# Patient Record
Sex: Male | Born: 1985
Health system: Southern US, Community
[De-identification: ages and names within clinical notes are randomized; demographics above are authoritative.]

---
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
## 2012-06-24 ENCOUNTER — Encounter (HOSPITAL_COMMUNITY): Payer: Self-pay | Admitting: *Deleted

## 2012-06-24 ENCOUNTER — Emergency Department (HOSPITAL_COMMUNITY)
Admission: EM | Admit: 2012-06-24 | Discharge: 2012-06-24 | Disposition: A | Discharge status: Home / Self Care | Payer: Managed Care, Other (non HMO) | Attending: Emergency Medicine | Admitting: Emergency Medicine

## 2012-06-24 DIAGNOSIS — N342 Other urethritis: Secondary | ICD-10-CM | POA: Insufficient documentation

## 2012-06-24 LAB — RPR: RPR Ser Ql: NONREACTIVE

## 2012-06-24 MED ORDER — AZITHROMYCIN 250 MG PO TABS
1000.0000 mg | ORAL_TABLET | Freq: Once | ORAL | Status: AC
  Administered 2012-06-24: 1000 mg via ORAL
  Filled 2012-06-24: qty 4

## 2012-06-24 MED ORDER — LIDOCAINE HCL (PF) 1 % IJ SOLN
INTRAMUSCULAR | Status: AC
  Administered 2012-06-24: 2 mL
  Filled 2012-06-24: qty 5

## 2012-06-24 MED ORDER — CEFTRIAXONE SODIUM 250 MG IJ SOLR
250.0000 mg | Freq: Once | INTRAMUSCULAR | Status: AC
  Administered 2012-06-24: 250 mg via INTRAMUSCULAR
  Filled 2012-06-24: qty 250

## 2012-06-24 NOTE — ED Notes (Signed)
C/o pain at the penis tip, onset 2d ago, (denies: abd or back pain, nvd, fever, d/c, dysuria), no meds PTA.

## 2012-06-24 NOTE — ED Notes (Signed)
Pt last had intercourse yesterday.

## 2012-06-24 NOTE — ED Provider Notes (Signed)
History     CSN: [id number]  Arrival date & time 06/24/12  1191   First MD Initiated Contact with Patient 06/24/12 [phone]      Chief Complaint  Patient presents with  . Penis Pain    (Consider location/radiation/quality/duration/timing/severity/associated sxs/prior treatment) HPI History provided by patient. Is sexually active. Has burning pain at the tip of his penis, states it feels like it's on the inside. No hematuria. No discharge. No rash or lesion. No history of same. No known STD exposures. No testicle pain. No ulcers. No medications.  Symptoms mild to moderate in severity. No known alleviating factors.  History reviewed. No pertinent past medical history.  History reviewed. No pertinent past surgical history.  No family history on file.  History  Substance Use Topics  . Smoking status: Never Smoker   . Smokeless tobacco: Not on file  . Alcohol Use: No      Review of Systems  Constitutional: Negative for fever and chills.  HENT: Negative for neck pain and neck stiffness.   Eyes: Negative for pain.  Respiratory: Negative for shortness of breath.   Cardiovascular: Negative for chest pain.  Gastrointestinal: Negative for abdominal pain.  Genitourinary: Negative for dysuria, hematuria, flank pain, discharge and difficulty urinating.  Musculoskeletal: Negative for back pain.  Skin: Negative for rash.  Neurological: Negative for headaches.  All other systems reviewed and are negative.    Allergies  Review of patient's allergies indicates no known allergies.  Home Medications  No current outpatient prescriptions on file.  BP 134/78  Pulse 88  Temp(Src) 98.4 F (36.9 C) (Oral)  Resp 14  SpO2 100%  Physical Exam  Constitutional: He is oriented to person, place, and time. He appears well-developed and well-nourished.  HENT:  Head: Normocephalic and atraumatic.  Eyes: EOM are normal. Pupils are equal, round, and reactive to light.  Neck: Neck supple.   Cardiovascular: Regular rhythm and intact distal pulses.   Pulmonary/Chest: Effort normal. No respiratory distress.  Genitourinary:  Circumcised. No erythema. No rash or lesion. No testicle tenderness.  Musculoskeletal: Normal range of motion. He exhibits no edema.  Neurological: He is alert and oriented to person, place, and time.  Skin: Skin is warm and dry.    ED Course  Procedures (including critical care time)  Labs Reviewed  GC/CHLAMYDIA PROBE AMP  RPR    azithromycin. Rocephin.  Referral to health department for results of GC Chlamydia RPR. STD precautions.    MDM  Clinical urethritis and sexually active young adult male  Treated for possible STD, Cx results pending  Vital signs and nursing notes reviewed and considered      Sunnie Nielsen, MD 06/24/12 0700

## 2012-06-24 NOTE — ED Notes (Signed)
Pt is denying discharge. Pt states he is not having any problems with urinating. No abdominal or pelvic pain noted. No fever. Pt states he just has a pain on the tip of his penis.

## 2012-06-24 NOTE — ED Notes (Signed)
No adverse reaction noted to the medication. Patient states "I feel fine"

## 2012-06-26 LAB — GC/CHLAMYDIA PROBE AMP: CT Probe RNA: NEGATIVE

## 2016-07-02 ENCOUNTER — Encounter (HOSPITAL_COMMUNITY): Payer: Self-pay

## 2016-07-02 ENCOUNTER — Emergency Department (HOSPITAL_COMMUNITY)
Admission: EM | Admit: 2016-07-02 | Discharge: 2016-07-03 | Disposition: A | Discharge status: Home / Self Care | Payer: Worker's Compensation | Attending: Emergency Medicine | Admitting: Emergency Medicine

## 2016-07-02 DIAGNOSIS — S61213A Laceration without foreign body of left middle finger without damage to nail, initial encounter: Secondary | ICD-10-CM | POA: Diagnosis not present

## 2016-07-02 DIAGNOSIS — Y990 Civilian activity done for income or pay: Secondary | ICD-10-CM | POA: Insufficient documentation

## 2016-07-02 DIAGNOSIS — Y939 Activity, unspecified: Secondary | ICD-10-CM | POA: Diagnosis not present

## 2016-07-02 DIAGNOSIS — Y929 Unspecified place or not applicable: Secondary | ICD-10-CM | POA: Insufficient documentation

## 2016-07-02 DIAGNOSIS — Z23 Encounter for immunization: Secondary | ICD-10-CM | POA: Insufficient documentation

## 2016-07-02 DIAGNOSIS — W268XXA Contact with other sharp object(s), not elsewhere classified, initial encounter: Secondary | ICD-10-CM | POA: Insufficient documentation

## 2016-07-02 DIAGNOSIS — S6992XA Unspecified injury of left wrist, hand and finger(s), initial encounter: Secondary | ICD-10-CM | POA: Diagnosis present

## 2016-07-02 MED ORDER — LIDOCAINE HCL (PF) 1 % IJ SOLN
5.0000 mL | Freq: Once | INTRAMUSCULAR | Status: AC
  Administered 2016-07-02: 5 mL via INTRADERMAL
  Filled 2016-07-02: qty 5

## 2016-07-02 NOTE — ED Triage Notes (Signed)
Pt states that he was at work at cut his middle finger on his L hand. Small half in laceration, bleeding controlled, last tetanus unknown.

## 2016-07-03 ENCOUNTER — Emergency Department (HOSPITAL_COMMUNITY): Payer: Worker's Compensation

## 2016-07-03 MED ORDER — OXYCODONE-ACETAMINOPHEN 5-325 MG PO TABS
1.0000 | ORAL_TABLET | Freq: Once | ORAL | Status: DC
Start: 2016-07-03 — End: 2016-07-03
  Filled 2016-07-03: qty 1

## 2016-07-03 MED ORDER — TETANUS-DIPHTH-ACELL PERTUSSIS 5-2.5-18.5 LF-MCG/0.5 IM SUSP
0.5000 mL | Freq: Once | INTRAMUSCULAR | Status: AC
  Administered 2016-07-03: 0.5 mL via INTRAMUSCULAR

## 2016-07-03 NOTE — Discharge Instructions (Signed)
Please have your stitches removed in 7 days. You may return to the Emergency Department or visit your primary care provider. Please keep the wound dry and wear your finger splint. Clean the wound with soap and water. Keep the finger covered when you are at work. Return to the Emergency Department if you develop new or worsening symptoms.

## 2016-07-03 NOTE — ED Notes (Signed)
Patient left at this time with all belongings. 

## 2016-07-04 NOTE — ED Provider Notes (Signed)
[facility]-EMERGENCY DEPT Provider Note   CSN: [id number] Arrival date & time: 07/02/16  2216     History   Chief Complaint Chief Complaint  Patient presents with  . Finger Injury    HPI Dominic Faulkner is a 31 y.o. male who presents with a laceration to the third digit of the left hand after got the digit cut in a machine at work. The wound is hemostatic in the ED. No other complaints at this time. Minimal pain at time this time. Last tetanus unknown.  No chronic medical conditions and no daily medications. NKA.   HPI  History reviewed. No pertinent past medical history.  There are no active problems to display for this patient.   History reviewed. No pertinent surgical history.     Home Medications    Prior to Admission medications   Not on File    Family History No family history on file.  Social History Social History  Substance Use Topics  . Smoking status: Never Smoker  . Smokeless tobacco: Not on file  . Alcohol use No     Allergies   Patient has no known allergies.   Review of Systems Review of Systems  Constitutional: Negative for activity change.  Respiratory: Negative for shortness of breath.   Cardiovascular: Negative for chest pain.  Gastrointestinal: Negative for anal bleeding.  Musculoskeletal: Negative for arthralgias and joint swelling.  Skin: Positive for wound.  Allergic/Immunologic: Negative for immunocompromised state.   Physical Exam Updated Vital Signs BP 136/85   Pulse 70   Temp 98.7 F (37.1 C)   Resp 16   Ht  (1.753 m)   Wt 71.7 kg   SpO2 100%   BMI 23.33 kg/m   Physical Exam  Constitutional: He appears well-developed and well-nourished.  HENT:  Head: Normocephalic and atraumatic.  Eyes: Conjunctivae are normal.  Neck: Neck supple.  Cardiovascular: Normal rate and regular rhythm.   No murmur heard. Pulmonary/Chest: Effort normal and breath sounds normal. No respiratory distress. He has no wheezes. He has no  rales.  Abdominal: Soft. He exhibits no distension. There is no tenderness. There is no guarding.  Musculoskeletal: He exhibits no edema.  1 cm hemostatic laceration to the left third digit between the DIP and PIP joint. NVI. Good strength to the third digit. No swelling, erythema, or warmth to the left hand.   Neurological: He is alert.  Skin: Skin is warm and dry.  Psychiatric: His behavior is normal.  Nursing note and vitals reviewed.  ED Treatments / Results  Labs (all labs ordered are listed, but only abnormal results are displayed) Labs Reviewed - No data to display  EKG  EKG Interpretation None       Radiology Dg Hand Complete Left  Result Date: 07/03/2016 CLINICAL DATA:  Injury to the middle finger, laceration EXAM: LEFT HAND - COMPLETE 3+ VIEW COMPARISON:  None. FINDINGS: No fracture or malalignment. No radiopaque foreign body. Soft tissue laceration along the palmar aspect of the distal third digit. IMPRESSION: No acute osseous abnormality Electronically Signed   By: Jasmine Pang M.D.   On: 07/03/2016 00:48    Procedures .Marland KitchenLaceration Repair Date/Time: 07/04/2016 3:58 PM Performed by: Dierdre Forth Authorized by: Frederik Pear A   Consent:    Consent obtained:  Verbal   Consent given by:  Patient   Risks discussed:  Infection, pain and poor wound healing   Alternatives discussed:  No treatment Anesthesia (see MAR for exact dosages):  Anesthesia method:  Local infiltration   Local anesthetic:  Lidocaine 1% w/o epi Laceration details:    Location: left third finger.   Length (cm):  1 Repair type:    Repair type:  Simple Pre-procedure details:    Preparation:  Patient was prepped and draped in usual sterile fashion and imaging obtained to evaluate for foreign bodies Exploration:    Hemostasis achieved with:  Direct pressure   Wound exploration: wound explored through full range of motion and entire depth of wound probed and visualized     Wound  extent: no fascia violation noted, no foreign bodies/material noted, no muscle damage noted, no nerve damage noted, no tendon damage noted, no underlying fracture noted and no vascular damage noted     Contaminated: no   Treatment:    Area cleansed with:  Shur-Clens   Amount of cleaning:  Standard   Irrigation method:  Pressure wash Skin repair:    Repair method:  Sutures   Suture size:  5-0   Suture material:  Prolene   Suture technique:  Horizontal mattress   Number of sutures:  3 Approximation:    Approximation:  Close Post-procedure details:    Dressing:  Splint for protection   Patient tolerance of procedure:  Tolerated well, no immediate complications   (including critical care time)  Medications Ordered in ED Medications  lidocaine (PF) (XYLOCAINE) 1 % injection 5 mL (5 mLs Intradermal Given 07/02/16 2340)  Tdap (BOOSTRIX) injection 0.5 mL (0.5 mLs Intramuscular Given 07/03/16 0350)   Initial Impression / Assessment and Plan / ED Course  I have reviewed the triage vital signs and the nursing notes.  Pertinent labs & imaging results that were available during my care of the patient were reviewed by me and considered in my medical decision making (see chart for details).     Tdap booster given.Pressure irrigation performed. Laceration occurred < 8 hours prior to repair which was well tolerated. Pt has no co morbidities to effect normal wound healing. No tendon or underlying muscle involvement after physical exam. Discussed and evaluated the patient with Dierdre Forth, PA-C. Discussed suture home care w pt and answered questions. Pt to f-u for wound check and suture removal in 7 days. Pt is hemodynamically stable w no complaints prior to dc.    Final Clinical Impressions(s) / ED Diagnoses   Final diagnoses:  Laceration of left middle finger without foreign body without damage to nail, initial encounter    New Prescriptions There are no discharge medications for this  patient.    Barkley Boards, PA-C 07/04/16 1607    Gilda Crease, MD 07/08/16 [phone]

## 2017-07-12 ENCOUNTER — Ambulatory Visit (INDEPENDENT_AMBULATORY_CARE_PROVIDER_SITE_OTHER): Payer: Managed Care, Other (non HMO)

## 2017-07-12 ENCOUNTER — Telehealth: Payer: Self-pay | Admitting: Podiatry

## 2017-07-12 ENCOUNTER — Ambulatory Visit (INDEPENDENT_AMBULATORY_CARE_PROVIDER_SITE_OTHER): Payer: Managed Care, Other (non HMO) | Admitting: Podiatry

## 2017-07-12 VITALS — BP 141/85 | HR 62

## 2017-07-12 DIAGNOSIS — M2141 Flat foot [pes planus] (acquired), right foot: Secondary | ICD-10-CM

## 2017-07-12 DIAGNOSIS — M79671 Pain in right foot: Secondary | ICD-10-CM | POA: Diagnosis not present

## 2017-07-12 DIAGNOSIS — M7661 Achilles tendinitis, right leg: Secondary | ICD-10-CM

## 2017-07-12 DIAGNOSIS — M2142 Flat foot [pes planus] (acquired), left foot: Secondary | ICD-10-CM

## 2017-07-12 DIAGNOSIS — M79672 Pain in left foot: Secondary | ICD-10-CM

## 2017-07-12 DIAGNOSIS — M205X9 Other deformities of toe(s) (acquired), unspecified foot: Secondary | ICD-10-CM

## 2017-07-12 MED ORDER — MELOXICAM 15 MG PO TABS: 15.0000 mg | ORAL_TABLET | Freq: Every day | ORAL | 2 refills | Status: AC

## 2017-07-12 NOTE — Progress Notes (Signed)
Subjective:   Patient ID: Dominic Faulkner, male   DOB: 32 y.o.   MRN: [id number]   HPI 32 year old male presents the office today for concerns of painful corns to both of his fifth toes which cause pain with pressure in shoes with the left side worse than the right.  He said no recent treatment for this.  Denies any recent injury.  He also has pain to the back of his right heel and he points to the Achilles tendon.  This is been ongoing for some time.  Denies any recent injury or trauma.  He has flat feet.  He has no other concerns.   Review of Systems  All other systems reviewed and are negative.  No past medical history on file.  No past surgical history on file.   Current Outpatient Medications:  .  chlorhexidine (PERIDEX) 0.12 % solution, chlorhexidine gluconate 0.12 % mouthwash  USE AS DIRECTED, Disp: , Rfl:  .  meloxicam (MOBIC) 15 MG tablet, meloxicam 15 mg tablet, Disp: , Rfl:  .  meloxicam (MOBIC) 15 MG tablet, Take 1 tablet (15 mg total) by mouth daily., Disp: 30 tablet, Rfl: 2 .  naproxen (NAPROSYN) 500 MG tablet, naproxen 500 mg tablet  TAKE 1 TABLET BY MOUTH TWICE A DAY AS DIRECTED, Disp: , Rfl:   No Known Allergies  Social History   Socioeconomic History  . Marital status: Single    Spouse name: Not on file  . Number of children: Not on file  . Years of education: Not on file  . Highest education level: Not on file  Occupational History  . Not on file  Social Needs  . Financial resource strain: Not on file  . Food insecurity:    Worry: Not on file    Inability: Not on file  . Transportation needs:    Medical: Not on file    Non-medical: Not on file  Tobacco Use  . Smoking status: Never Smoker  Substance and Sexual Activity  . Alcohol use: No  . Drug use: No  . Sexual activity: Not on file  Lifestyle  . Physical activity:    Days per week: Not on file    Minutes per session: Not on file  . Stress: Not on file  Relationships  . Social connections:     Talks on phone: Not on file    Gets together: Not on file    Attends religious service: Not on file    Active member of club or organization: Not on file    Attends meetings of clubs or organizations: Not on file    Relationship status: Not on file  . Intimate partner violence:    Fear of current or ex partner: Not on file    Emotionally abused: Not on file    Physically abused: Not on file    Forced sexual activity: Not on file  Other Topics Concern  . Not on file  Social History Narrative  . Not on file        Objective:  Physical Exam  General: AAO x3, NAD  Dermatological: Hyperkeratotic lesions present to the dorsal lateral aspect of bilateral fifth toes with the left side worse than the right.  Upon debridement there is no underlying ulceration, drainage or any signs of infection noted today.  No other open lesions or pre-ulcerative lesions.  Vascular: Dorsalis Pedis artery and Posterior Tibial artery pedal pulses are 2/4 bilateral with immedate capillary fill time. Pedal hair growth present.  No varicosities and no lower extremity edema present bilateral. There is no pain with calf compression, swelling, warmth, erythema.   Neruologic: Grossly intact via light touch bilateral. Vibratory intact via tuning fork bilateral. Protective threshold with Semmes Wienstein monofilament intact to all pedal sites bilateral.  Musculoskeletal: Mild tenderness palpation of the right Achilles tendon on the mid substance.  Thompson test is negative and there is no defect noted within the Achilles tendon.  There is no thickening.  There is no overlying edema, erythema, increase in warmth.  There is no pain with lateral compression of the calcaneus.  Forefoot is present.  Muscular strength 5/5 in all groups tested bilateral.  Adductovarus right and left  Gait: Unassisted, Nonantalgic.       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  -Sharply debrided the hyperkeratotic lesions to bilateral fifth toe so that any complications or bleeding.  We discussed with conservative as well as surgical options.  We will start with offloading we also discussed shoe modifications.  We discussed moisturizer the callus is daily but not interdigitally.  Also discussed orthotics.  If symptoms continue we will consider surgical intervention which we discussed today. -Regards to the Achilles tendinitis I prescribed meloxicam.  Stretching, icing exercises daily as well as supportive shoes and orthotics. Will check insurance coverage.   Vivi Barrack DPM

## 2017-07-12 NOTE — Patient Instructions (Signed)

## 2017-07-12 NOTE — Telephone Encounter (Signed)
Called pt to give benefit coverage for orthotics and voicemail is full so I could not leave a message.

## 2017-08-16 ENCOUNTER — Telehealth: Payer: Self-pay | Admitting: Podiatry

## 2017-08-16 NOTE — Telephone Encounter (Signed)
Called pt again and he answered gave him benefit information and he wants to proceed and is scheduled to see Raiford NobleRick on 6.10.19

## 2017-08-22 ENCOUNTER — Ambulatory Visit (INDEPENDENT_AMBULATORY_CARE_PROVIDER_SITE_OTHER): Payer: Self-pay | Admitting: Orthotics

## 2017-08-22 DIAGNOSIS — M2141 Flat foot [pes planus] (acquired), right foot: Secondary | ICD-10-CM | POA: Diagnosis not present

## 2017-08-22 DIAGNOSIS — M2142 Flat foot [pes planus] (acquired), left foot: Secondary | ICD-10-CM | POA: Diagnosis not present

## 2017-08-22 DIAGNOSIS — M7661 Achilles tendinitis, right leg: Secondary | ICD-10-CM

## 2017-08-22 NOTE — Progress Notes (Signed)
Dominic Faulkner came into today to be cast for Custom Foot Orthotics. Upon recommendation of Dr. Ardelle AntonWagoner Dominic Faulkner presents with painful keratomas b/l and achilles tendonits R Goals are offloading 5 b/l and heel raise b/l Plan vendor [place]

## 2017-09-05 ENCOUNTER — Ambulatory Visit (INDEPENDENT_AMBULATORY_CARE_PROVIDER_SITE_OTHER): Payer: Managed Care, Other (non HMO) | Admitting: Orthotics

## 2017-09-05 DIAGNOSIS — M7661 Achilles tendinitis, right leg: Secondary | ICD-10-CM

## 2017-09-05 NOTE — Progress Notes (Signed)
Patient came in today to pick up custom made foot orthotics.  The goals were accomplished and the patient reported no dissatisfaction with said orthotics.  Patient was advised of breakin period and how to report any issues. 

## 2019-02-22 IMAGING — CR DG HAND COMPLETE 3+V*L*
3 series · 3 of 3 positions shown · non-contrast
Comparison: None.

CLINICAL DATA: Injury to the middle finger, laceration

EXAM:
LEFT HAND - COMPLETE 3+ VIEW

[hand pa]
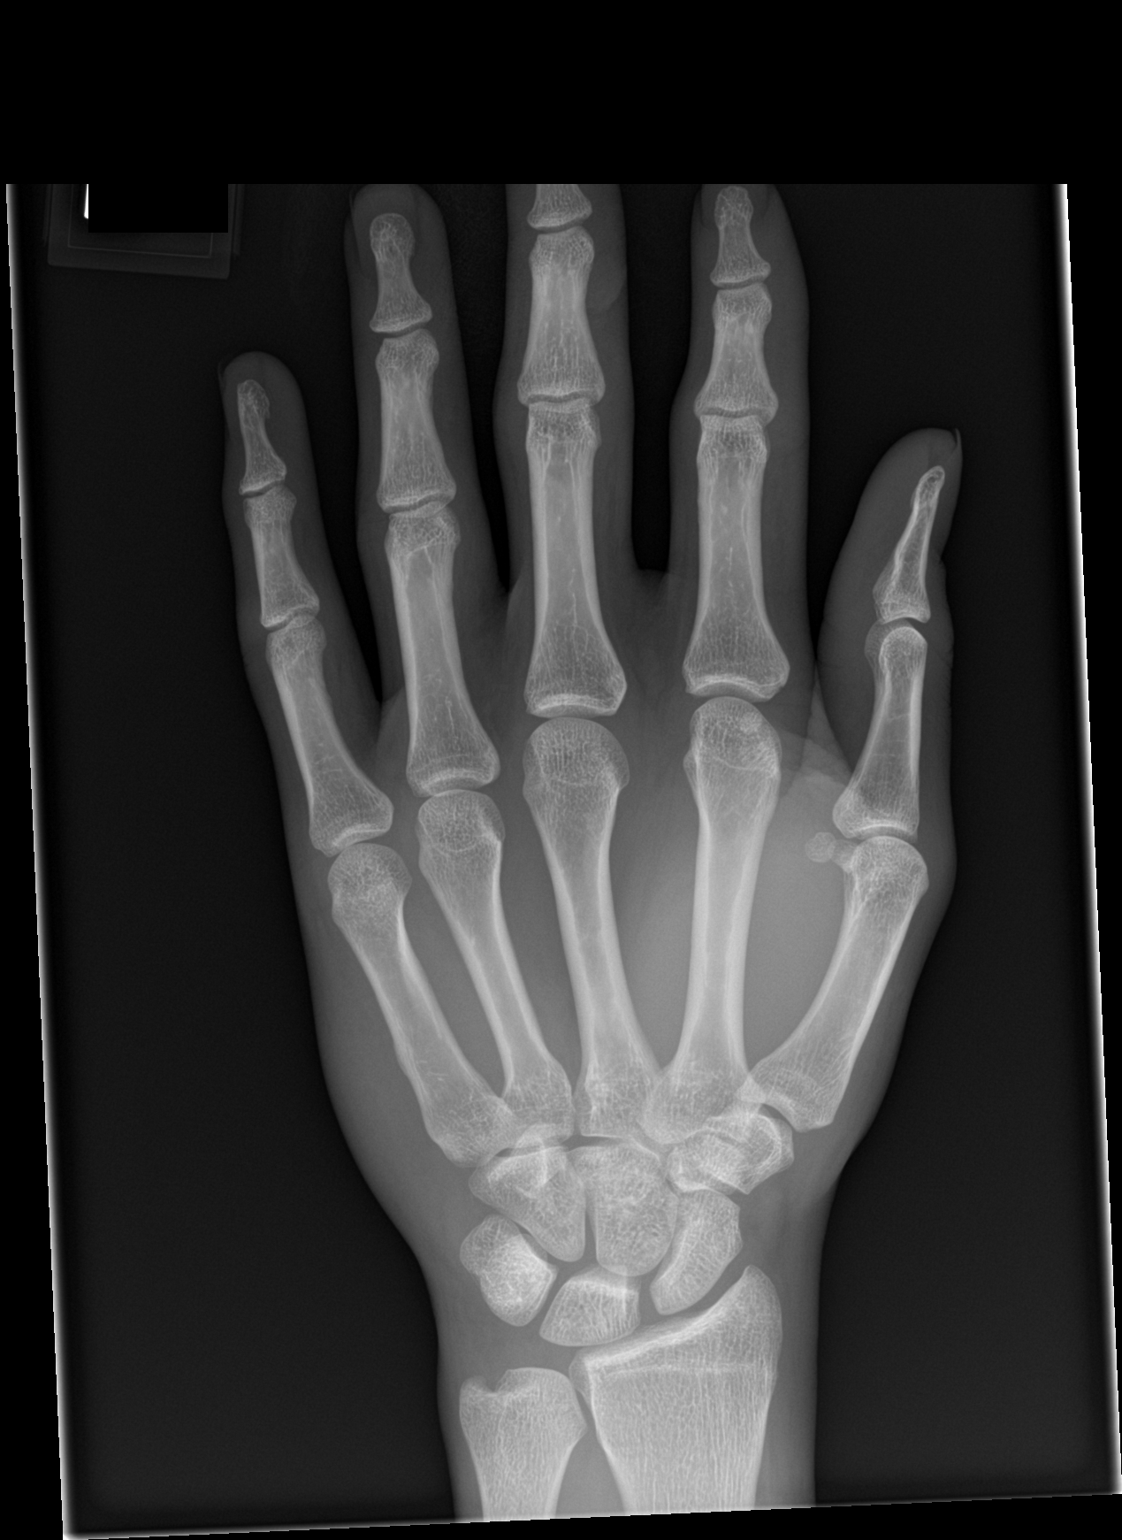

[hand obl]
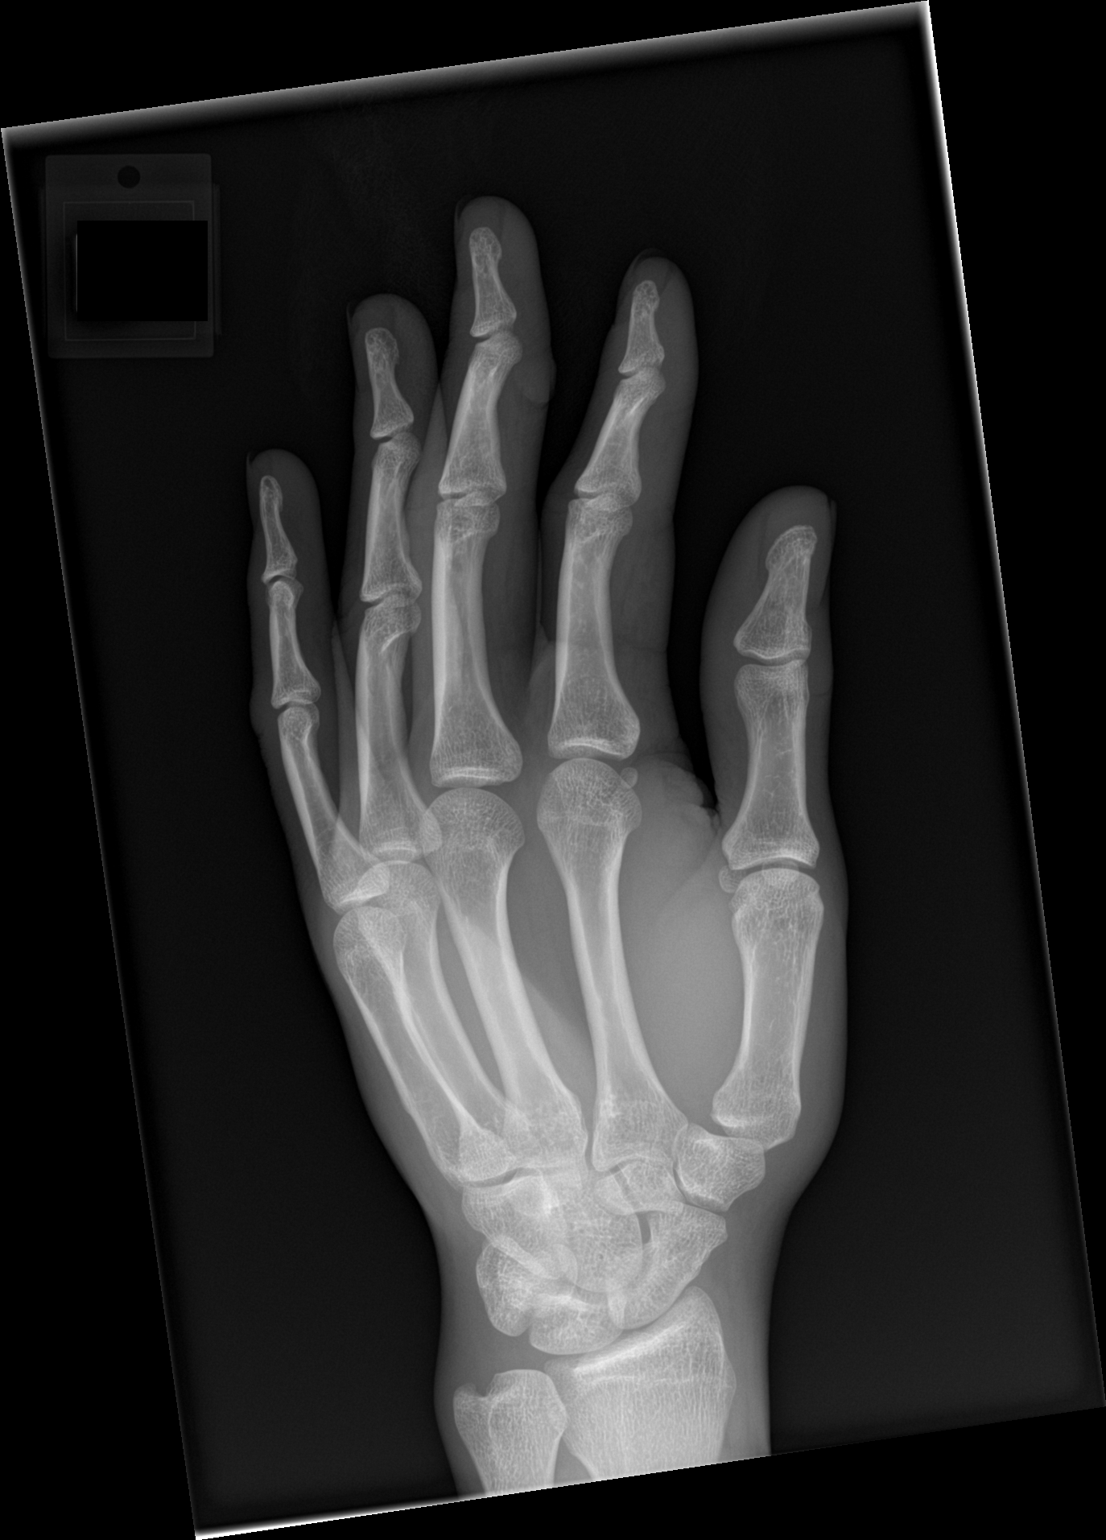

[hand lat]
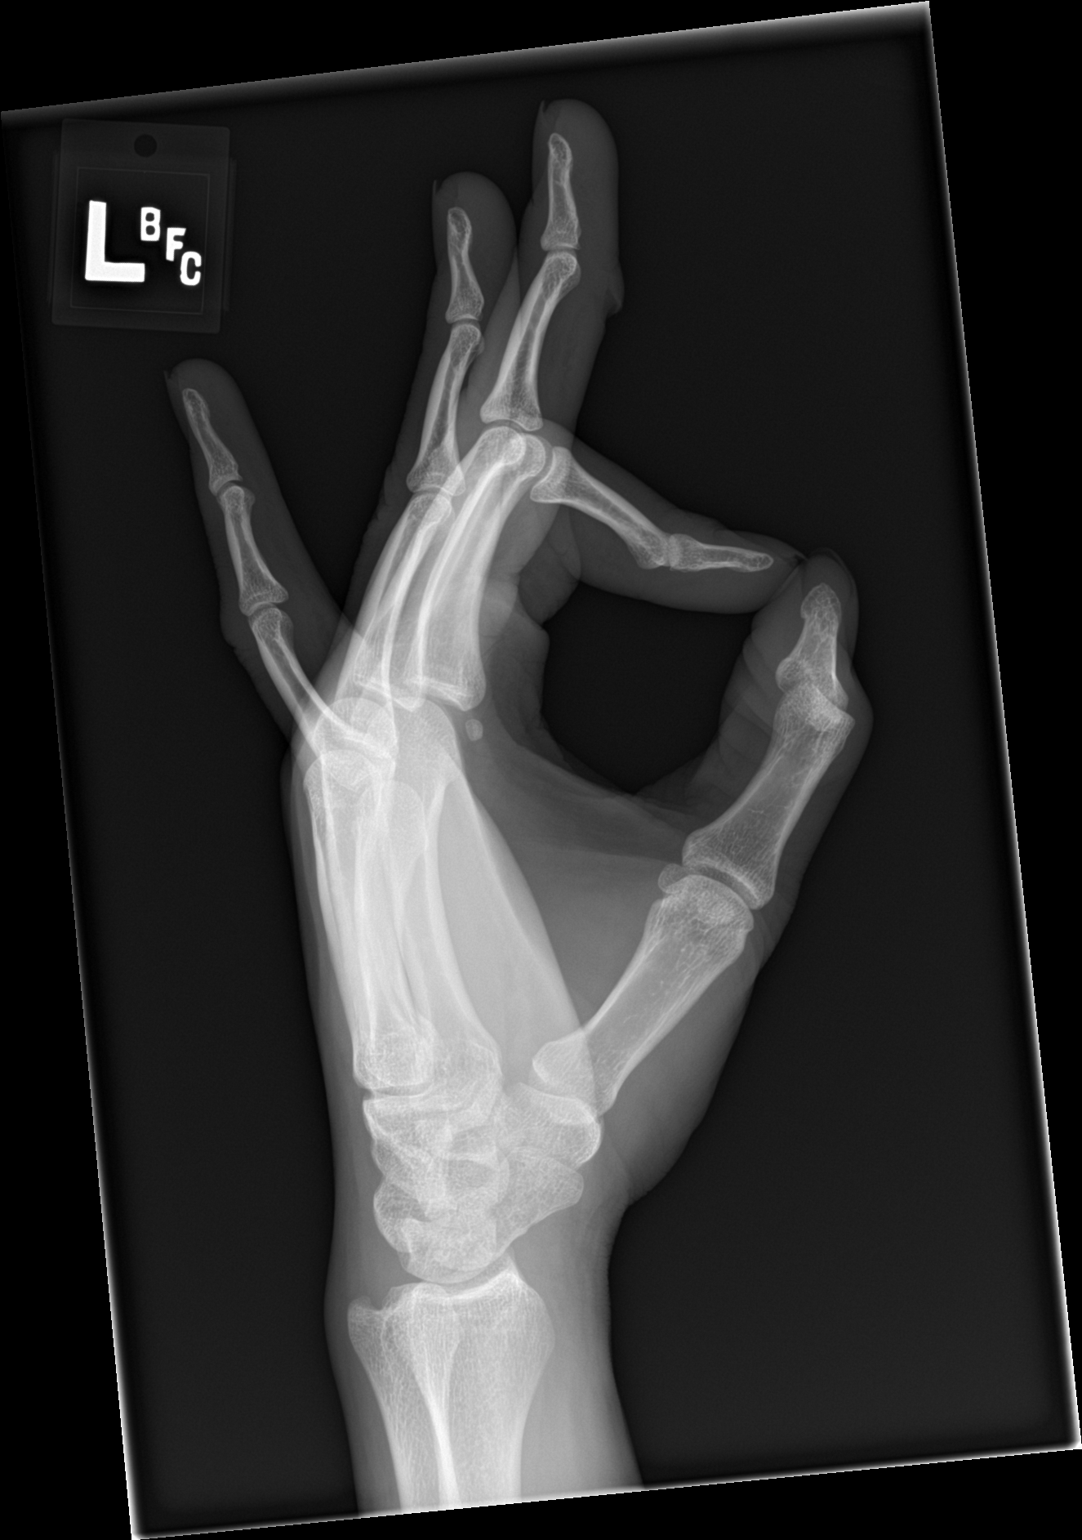

[3 of 3 positions shown; findings below may reference images not displayed]

FINDINGS: No fracture or malalignment. No radiopaque foreign body. Soft tissue
laceration along the palmar aspect of the distal third digit.
IMPRESSION: No acute osseous abnormality

## 2023-05-24 ENCOUNTER — Emergency Department (HOSPITAL_COMMUNITY)
Admission: EM | Admit: 2023-05-24 | Discharge: 2023-05-24 | Disposition: A | Discharge status: Home / Self Care | Payer: Self-pay | Attending: Emergency Medicine | Admitting: Emergency Medicine

## 2023-05-24 ENCOUNTER — Encounter (HOSPITAL_COMMUNITY): Payer: Self-pay

## 2023-05-24 ENCOUNTER — Other Ambulatory Visit: Payer: Self-pay

## 2023-05-24 ENCOUNTER — Emergency Department (HOSPITAL_COMMUNITY): Payer: Self-pay

## 2023-05-24 DIAGNOSIS — W231XXA Caught, crushed, jammed, or pinched between stationary objects, initial encounter: Secondary | ICD-10-CM | POA: Insufficient documentation

## 2023-05-24 DIAGNOSIS — Y9367 Activity, basketball: Secondary | ICD-10-CM | POA: Insufficient documentation

## 2023-05-24 DIAGNOSIS — S63634A Sprain of interphalangeal joint of right ring finger, initial encounter: Secondary | ICD-10-CM | POA: Insufficient documentation

## 2023-05-24 NOTE — ED Provider Notes (Signed)
 [place] EM[facility] DEPARTMENT AT [facility] Provider Note   CSN: [id number] Arrival date & time: 05/24/23  6578     History  Chief Complaint  Patient presents with   Finger Injury    Dominic Faulkner is a 38 y.o. male patient with no significant past medical history reporting to emergency room with right fourth digit pain.  Patient reports that 3 weeks ago he was playing basketball and jammed his finger against under the ball.  Patient reports that he fell his finger dislocate.  Since then he has had constant pain and swelling over proximal phalangeal joint.  He has been taking Tylenol which helps improve symptoms temporarily.  Patient reports he is not able to move his finger without significant discomfort and swelling limits full range of motion.  He has had no recent surgeries on this hand.  No open laceration.  HPI     Home Medications Prior to Admission medications   Medication Sig Start Date End Date Taking? Authorizing Provider  chlorhexidine (PERIDEX) 0.12 % solution chlorhexidine gluconate 0.12 % mouthwash  USE AS DIRECTED    [provider]  meloxicam (MOBIC) 15 MG tablet meloxicam 15 mg tablet    [provider]  naproxen (NAPROSYN) 500 MG tablet naproxen 500 mg tablet  TAKE 1 TABLET BY MOUTH TWICE A DAY AS DIRECTED    [provider]      Allergies    Patient has no known allergies.    Review of Systems   Review of Systems  Musculoskeletal:  Positive for arthralgias.    Physical Exam Updated Vital Signs BP (!) 152/90 (BP Location: Left Arm)   Pulse 88   Temp 98.4 F (36.9 C) (Oral)   Resp 16   Ht 5\' 9"  (1.753 m)   Wt 74.8 kg   SpO2 99%   BMI 24.37 kg/m  Physical Exam Vitals and nursing note reviewed.  Constitutional:      General: He is not in acute distress.    Appearance: He is not toxic-appearing.  HENT:     Head: Normocephalic and atraumatic.  Eyes:     General: No scleral icterus.     Conjunctiva/sclera: Conjunctivae normal.  Cardiovascular:     Rate and Rhythm: Normal rate and regular rhythm.     Pulses: Normal pulses.     Heart sounds: Normal heart sounds.  Pulmonary:     Effort: Pulmonary effort is normal. No respiratory distress.     Breath sounds: Normal breath sounds.  Abdominal:     General: Abdomen is flat. Bowel sounds are normal.     Palpations: Abdomen is soft.     Tenderness: There is no abdominal tenderness.  Musculoskeletal:     Comments: Right 4th PIP joint swelling. No deformity noted. ROM of PIP limited due to pain and swelling. No other focal area of tenderness. Neurovascularly intact.   Skin:    General: Skin is warm and dry.     Findings: No lesion.  Neurological:     General: No focal deficit present.     Mental Status: He is alert and oriented to person, place, and time. Mental status is at baseline.     ED Results / Procedures / Treatments   Labs (all labs ordered are listed, but only abnormal results are displayed) Labs Reviewed - No data to display  EKG None  Radiology No results found.  Procedures Procedures    Medications Ordered in ED Medications - No data  to display  ED Course/ Medical Decision Making/ A&P                                 Medical Decision Making Amount and/or Complexity of Data Reviewed Radiology: ordered.   This patient presents to the ED for concern of finger pain, this involves an extensive number of treatment options, and is a complaint that carries with it a high risk of complications and morbidity.  The differential diagnosis includes gout, pseudogout, dislocation, subluxation, fracture, sprain, laceration, abscess, septic joint    Imaging Studies ordered:  I ordered imaging studies including right index finger x-ray  I independently visualized and interpreted imaging which showed PENDING at time of discharge however I did review the imaging and I do not see any acute obvious fracture nor  any dislocation   Problem List / ED Course / Critical interventions / Medication management  Patient reporting with right finger pain.  This has been ongoing for 3 weeks.  He has no sign of infection.  He is able to initiate range of motion.  He did have initial injury in which he jammed and dislocated finger. He was able to relocate it. Has had pain, swelling and decreased ROM since injury. He is neurovascularly intact.  Vital signs are stable and he is well-appearing.  No fever. Will obtain x-ray to rule out fracture or other pathology. He has not other injuries during incident, thus no further workup needed at this time. Patient reports pain is under control at this time.  Printed imaging for patient.  No obvious fracture or dislocation.  This has been ongoing for 3 weeks.  Will buddy tape fingers.  Patient already has finger splint at home.  I will give him follow-up for orthopedics.  He does not currently have a primary care.  He is stable for discharge.  I have reviewed the patients home medicines and have made adjustments as needed   Plan  F/u w/ PCP in 2-3d to ensure resolution of sx.  Patient was given return precautions. Patient stable for discharge at this time.  Patient educated on sx/dx and verbalized understanding of plan. Return to ER w/ new or worsening sx.          Final Clinical Impression(s) / ED Diagnoses Final diagnoses:  Sprain of interphalangeal joint of right ring finger, initial encounter    Rx / DC Orders ED Discharge Orders     None         Smitty Knudsen, PA-C 05/24/23 1008    Gloris Manchester, MD 05/24/23 1456

## 2023-05-24 NOTE — Discharge Instructions (Addendum)
 I would recommend taking anti-inflammatory like ibuprofen 3 times a day.  You can also take Tylenol 1000 mg every 6 hours.  I recommend icing over joint.  He can buddy tape your fingers together or use finger splint. Follow up with orthopedics.

## 2023-05-24 NOTE — ED Triage Notes (Signed)
 Patient is here for evaluation of right 4th digit injury. Pt states, "my finger is dislocated." States this happened about 3 weeks ago. Able to move finger and cap refill <3 sec.
